# Patient Record
Sex: Female | Born: 1985 | Race: White | Hispanic: No | Marital: Married | Smoking: Never smoker
Health system: Southern US, Community
[De-identification: ages and names within clinical notes are randomized; demographics above are authoritative.]

## PROBLEM LIST (undated history)

## (undated) DIAGNOSIS — Z789 Other specified health status: Secondary | ICD-10-CM

## (undated) HISTORY — PX: NO PAST SURGERIES: SHX2092

---

## 2011-02-14 ENCOUNTER — Inpatient Hospital Stay (HOSPITAL_COMMUNITY)
Admission: AD | Admit: 2011-02-14 | Discharge: 2011-02-17 | DRG: 775 | Disposition: A | Discharge status: Home / Self Care | Payer: 59 | Attending: Obstetrics and Gynecology | Admitting: Obstetrics and Gynecology

## 2011-02-14 LAB — CBC
HCT: 37.8 % (ref 36.0–46.0)
Hemoglobin: 13 g/dL (ref 12.0–15.0)
MCH: 33.5 pg (ref 26.0–34.0)
MCHC: 34.4 g/dL (ref 30.0–36.0)
MCV: 97.4 fL (ref 78.0–100.0)
RDW: 13.3 % (ref 11.5–15.5)

## 2011-02-16 LAB — CBC
HCT: 32.3 % — ABNORMAL LOW (ref 36.0–46.0)
MCH: 32.5 pg (ref 26.0–34.0)
MCHC: 32.5 g/dL (ref 30.0–36.0)
MCV: 100 fL (ref 78.0–100.0)
RDW: 13.6 % (ref 11.5–15.5)
WBC: 13 10*3/uL — ABNORMAL HIGH (ref 4.0–10.5)

## 2012-07-16 LAB — OB RESULTS CONSOLE ABO/RH: RH Type: POSITIVE

## 2012-07-16 LAB — OB RESULTS CONSOLE ANTIBODY SCREEN: Antibody Screen: NEGATIVE

## 2012-07-16 LAB — OB RESULTS CONSOLE HIV ANTIBODY (ROUTINE TESTING): HIV: NONREACTIVE

## 2012-10-08 NOTE — L&D Delivery Note (Signed)
SVD of VFI; EBL 250cc.  Weight and cord pH pending.  APGARs 9,9. Head delivered ROA with mouth and nose bulb suctioned.  Body delivered atraumatically.  Cord clamped, cut, and baby to abdomen.  Cord pH obtained.  Placenta delivered I/3VC with manual extraction.  Fundus firmed with pitocin and massage. Vaginal laceration repaired with 3-0 Rapide in figure-of-8 stitch.  Mom and baby stable.

## 2013-01-20 LAB — OB RESULTS CONSOLE GBS: GBS: NEGATIVE

## 2013-02-14 ENCOUNTER — Encounter (HOSPITAL_COMMUNITY): Payer: Self-pay | Admitting: Anesthesiology

## 2013-02-14 ENCOUNTER — Encounter (HOSPITAL_COMMUNITY): Payer: Self-pay | Admitting: *Deleted

## 2013-02-14 ENCOUNTER — Inpatient Hospital Stay (HOSPITAL_COMMUNITY)
Admission: AD | Admit: 2013-02-14 | Discharge: 2013-02-16 | DRG: 774 | Disposition: A | Discharge status: Home / Self Care | Payer: 59 | Source: Ambulatory Visit | Attending: Obstetrics & Gynecology | Admitting: Obstetrics & Gynecology

## 2013-02-14 ENCOUNTER — Inpatient Hospital Stay (HOSPITAL_COMMUNITY): Payer: 59 | Admitting: Anesthesiology

## 2013-02-14 DIAGNOSIS — O4290 Premature rupture of membranes, unspecified as to length of time between rupture and onset of labor, unspecified weeks of gestation: Principal | ICD-10-CM | POA: Diagnosis present

## 2013-02-14 HISTORY — DX: Other specified health status: Z78.9

## 2013-02-14 LAB — CBC
MCH: 33.5 pg (ref 26.0–34.0)
MCHC: 33.7 g/dL (ref 30.0–36.0)
MCV: 99.5 fL (ref 78.0–100.0)
Platelets: 187 10*3/uL (ref 150–400)
RBC: 3.7 MIL/uL — ABNORMAL LOW (ref 3.87–5.11)

## 2013-02-14 LAB — POCT FERN TEST: POCT Fern Test: NEGATIVE

## 2013-02-14 MED ORDER — LACTATED RINGERS IV SOLN
INTRAVENOUS | Status: DC
  Administered 2013-02-14 (×3): via INTRAVENOUS

## 2013-02-14 MED ORDER — LIDOCAINE HCL (PF) 1 % IJ SOLN
30.0000 mL | INTRAMUSCULAR | Status: DC | PRN
  Filled 2013-02-14 (×2): qty 30

## 2013-02-14 MED ORDER — EPHEDRINE 5 MG/ML INJ
10.0000 mg | INTRAVENOUS | Status: DC | PRN
  Filled 2013-02-14: qty 2

## 2013-02-14 MED ORDER — PHENYLEPHRINE 40 MCG/ML (10ML) SYRINGE FOR IV PUSH (FOR BLOOD PRESSURE SUPPORT)
80.0000 ug | PREFILLED_SYRINGE | INTRAVENOUS | Status: DC | PRN
  Filled 2013-02-14: qty 2

## 2013-02-14 MED ORDER — EPHEDRINE 5 MG/ML INJ
10.0000 mg | INTRAVENOUS | Status: DC | PRN
  Filled 2013-02-14: qty 4
  Filled 2013-02-14: qty 2

## 2013-02-14 MED ORDER — FLEET ENEMA 7-19 GM/118ML RE ENEM: 1.0000 | ENEMA | RECTAL | Status: DC | PRN

## 2013-02-14 MED ORDER — LACTATED RINGERS IV SOLN
500.0000 mL | Freq: Once | INTRAVENOUS | Status: AC
  Administered 2013-02-14: 500 mL via INTRAVENOUS

## 2013-02-14 MED ORDER — CITRIC ACID-SODIUM CITRATE 334-500 MG/5ML PO SOLN: 30.0000 mL | ORAL | Status: DC | PRN

## 2013-02-14 MED ORDER — FENTANYL 2.5 MCG/ML BUPIVACAINE 1/10 % EPIDURAL INFUSION (WH - ANES)
14.0000 mL/h | INTRAMUSCULAR | Status: DC | PRN
  Administered 2013-02-14: 14 mL/h via EPIDURAL
  Filled 2013-02-14: qty 125

## 2013-02-14 MED ORDER — TERBUTALINE SULFATE 1 MG/ML IJ SOLN: 0.2500 mg | Freq: Once | INTRAMUSCULAR | Status: AC | PRN

## 2013-02-14 MED ORDER — ACETAMINOPHEN 325 MG PO TABS: 650.0000 mg | ORAL_TABLET | ORAL | Status: DC | PRN

## 2013-02-14 MED ORDER — DIPHENHYDRAMINE HCL 50 MG/ML IJ SOLN
12.5000 mg | INTRAMUSCULAR | Status: DC | PRN
  Administered 2013-02-14: 12.5 mg via INTRAVENOUS
  Filled 2013-02-14: qty 1

## 2013-02-14 MED ORDER — OXYCODONE-ACETAMINOPHEN 5-325 MG PO TABS: 1.0000 | ORAL_TABLET | ORAL | Status: DC | PRN

## 2013-02-14 MED ORDER — LIDOCAINE HCL (PF) 1 % IJ SOLN
INTRAMUSCULAR | Status: DC | PRN
  Administered 2013-02-14 (×4): 4 mL

## 2013-02-14 MED ORDER — PHENYLEPHRINE 40 MCG/ML (10ML) SYRINGE FOR IV PUSH (FOR BLOOD PRESSURE SUPPORT)
80.0000 ug | PREFILLED_SYRINGE | INTRAVENOUS | Status: DC | PRN
  Filled 2013-02-14: qty 5
  Filled 2013-02-14: qty 2

## 2013-02-14 MED ORDER — OXYTOCIN 40 UNITS IN LACTATED RINGERS INFUSION - SIMPLE MED
62.5000 mL/h | INTRAVENOUS | Status: DC
  Filled 2013-02-14: qty 1000

## 2013-02-14 MED ORDER — ONDANSETRON HCL 4 MG/2ML IJ SOLN
4.0000 mg | Freq: Four times a day (QID) | INTRAMUSCULAR | Status: DC | PRN
  Administered 2013-02-14: 4 mg via INTRAVENOUS
  Filled 2013-02-14: qty 2

## 2013-02-14 MED ORDER — IBUPROFEN 600 MG PO TABS
600.0000 mg | ORAL_TABLET | Freq: Four times a day (QID) | ORAL | Status: DC | PRN
  Administered 2013-02-15: 600 mg via ORAL
  Filled 2013-02-14: qty 1

## 2013-02-14 MED ORDER — LACTATED RINGERS IV SOLN
500.0000 mL | INTRAVENOUS | Status: DC | PRN
  Administered 2013-02-14: 300 mL via INTRAVENOUS

## 2013-02-14 MED ORDER — OXYTOCIN BOLUS FROM INFUSION
500.0000 mL | INTRAVENOUS | Status: DC
  Administered 2013-02-15: 500 mL via INTRAVENOUS

## 2013-02-14 MED ORDER — OXYTOCIN 40 UNITS IN LACTATED RINGERS INFUSION - SIMPLE MED
1.0000 m[IU]/min | INTRAVENOUS | Status: DC
  Administered 2013-02-14: 2 m[IU]/min via INTRAVENOUS

## 2013-02-14 NOTE — Progress Notes (Signed)
Dr Langston Masker notified of pts amnisure results and she is putting in admission orders.

## 2013-02-14 NOTE — Anesthesia Procedure Notes (Addendum)
Epidural Patient location during procedure: OB Start time: 02/14/2013 8:03 PM  Staffing Performed by: anesthesiologist   Preanesthetic Checklist Completed: patient identified, site marked, surgical consent, pre-op evaluation, timeout performed, IV checked, risks and benefits discussed and monitors and equipment checked  Epidural Patient position: sitting Prep: site prepped and draped and DuraPrep Patient monitoring: continuous pulse ox and blood pressure Approach: midline Injection technique: LOR air  Needle:  Needle type: Tuohy  Needle gauge: 17 G Needle length: 9 cm and 9 Needle insertion depth: 4 cm Catheter type: closed end flexible Catheter size: 19 Gauge Catheter at skin depth: 9 cm Test dose: negative  Assessment Events: blood not aspirated, injection not painful, no injection resistance, negative IV test and paresthesia (right leg transient)  Additional Notes Discussed risk of headache, infection, bleeding, nerve injury and failed or incomplete block.  Patient voices understanding and wishes to proceed.  Epidural placed easily on first attempt.  Right leg transient paresthesia.  Patient tolerated procedure well with no apparent complications.  [name] MDReason for block:procedure for pain

## 2013-02-14 NOTE — H&P (Signed)
Katelyn Terrell is a 27 y.o. female presenting for PROM at 0530 this am.  She reports no change in her CTX frequency from 1-2/hour.  No VB.  +FM.  Antepartum course uncomplicated.  Neg GBS.  Maternal Medical History:  Reason for admission: Rupture of membranes.   Contractions: Onset was 1 week ago.   Frequency: rare.   Perceived severity is mild.    Fetal activity: Perceived fetal activity is normal.   Last perceived fetal movement was within the past hour.    Prenatal complications: no prenatal complications Prenatal Complications - Diabetes: none.    OB History   Grav Para Term Preterm Abortions TAB SAB Ect Mult Living   2 1 1  0 0 0  0 0 1     Past Medical History  Diagnosis Date  . Medical history non-contributory    Past Surgical History  Procedure Laterality Date  . No past surgeries     Family History: family history is negative for Alcohol abuse. Social History:  reports that she has never smoked. She has never used smokeless tobacco. She reports that she does not drink alcohol or use illicit drugs.   Prenatal Transfer Tool  Maternal Diabetes: No Genetic Screening: Normal Maternal Ultrasounds/Referrals: Normal Fetal Ultrasounds or other Referrals:  None Maternal Substance Abuse:  No Significant Maternal Medications:  None Significant Maternal Lab Results:  None Other Comments:  None  ROS  Dilation: 2 Effacement (%): 50 Station: -2 Exam by:: V Smith CNM Blood pressure 126/54, pulse 71, temperature 98.4 F (36.9 C), temperature source Oral, resp. rate 20, height 5\' 3"  (1.6 m), weight 146 lb 6.4 oz (66.407 kg), SpO2 100.00%. Maternal Exam:  Uterine Assessment: Contraction strength is mild.  Contraction frequency is rare.   Abdomen: Patient reports no abdominal tenderness. Fundal height is c/w dates.   Estimated fetal weight is 7#2.   Fetal presentation: vertex     Physical Exam  Constitutional: She is oriented to person, place, and time. She appears  well-developed and well-nourished.  GI: Soft. There is no rebound and no guarding.  Neurological: She is alert and oriented to person, place, and time.  Skin: Skin is warm and dry.  Psychiatric: She has a normal mood and affect. Her behavior is normal.    Prenatal labs: ABO, Rh: O/Positive/-- (10/09 0000) Antibody: Negative (10/09 0000) Rubella: Immune (10/09 0000) RPR: Nonreactive (10/09 0000)  HBsAg: Negative (10/09 0000)  HIV: Non-reactive (10/09 0000)  GBS: Negative (04/15 0000)   Assessment/Plan: 27yo G2P1001 with PROM -Patient would like to labor without augmentation.  Will allow until 1800 and add pitocin if needed. -Epidural if desired -GBS negative   Katelyn Terrell 02/14/2013, 12:07 PM

## 2013-02-14 NOTE — Anesthesia Preprocedure Evaluation (Signed)

## 2013-02-14 NOTE — MAU Note (Signed)
Leaking fld since 0530. Having some back pain

## 2013-02-14 NOTE — Progress Notes (Signed)
Dr Langston Masker notified of pts fern results and orders received.

## 2013-02-15 ENCOUNTER — Encounter (HOSPITAL_COMMUNITY): Payer: Self-pay | Admitting: Family Medicine

## 2013-02-15 LAB — CBC
MCH: 33.4 pg (ref 26.0–34.0)
Platelets: 180 10*3/uL (ref 150–400)
RBC: 3.47 MIL/uL — ABNORMAL LOW (ref 3.87–5.11)

## 2013-02-15 MED ORDER — OXYCODONE-ACETAMINOPHEN 5-325 MG PO TABS
1.0000 | ORAL_TABLET | ORAL | Status: DC | PRN
  Administered 2013-02-15 – 2013-02-16 (×4): 1 via ORAL
  Filled 2013-02-15 (×4): qty 1

## 2013-02-15 MED ORDER — PRENATAL MULTIVITAMIN CH
1.0000 | ORAL_TABLET | Freq: Every day | ORAL | Status: DC
  Administered 2013-02-15 – 2013-02-16 (×2): 1 via ORAL
  Filled 2013-02-15 (×2): qty 1

## 2013-02-15 MED ORDER — WITCH HAZEL-GLYCERIN EX PADS: 1.0000 "application " | MEDICATED_PAD | CUTANEOUS | Status: DC | PRN

## 2013-02-15 MED ORDER — DIPHENHYDRAMINE HCL 25 MG PO CAPS: 25.0000 mg | ORAL_CAPSULE | Freq: Four times a day (QID) | ORAL | Status: DC | PRN

## 2013-02-15 MED ORDER — ONDANSETRON HCL 4 MG/2ML IJ SOLN: 4.0000 mg | INTRAMUSCULAR | Status: DC | PRN

## 2013-02-15 MED ORDER — SIMETHICONE 80 MG PO CHEW: 80.0000 mg | CHEWABLE_TABLET | ORAL | Status: DC | PRN

## 2013-02-15 MED ORDER — ONDANSETRON HCL 4 MG PO TABS: 4.0000 mg | ORAL_TABLET | ORAL | Status: DC | PRN

## 2013-02-15 MED ORDER — ZOLPIDEM TARTRATE 5 MG PO TABS: 5.0000 mg | ORAL_TABLET | Freq: Every evening | ORAL | Status: DC | PRN

## 2013-02-15 MED ORDER — BENZOCAINE-MENTHOL 20-0.5 % EX AERO: 1.0000 "application " | INHALATION_SPRAY | CUTANEOUS | Status: DC | PRN

## 2013-02-15 MED ORDER — DOXYCYCLINE HYCLATE 100 MG PO TABS
100.0000 mg | ORAL_TABLET | Freq: Two times a day (BID) | ORAL | Status: AC
  Administered 2013-02-15 (×2): 100 mg via ORAL
  Filled 2013-02-15 (×2): qty 1

## 2013-02-15 MED ORDER — DIBUCAINE 1 % RE OINT: 1.0000 "application " | TOPICAL_OINTMENT | RECTAL | Status: DC | PRN

## 2013-02-15 MED ORDER — SENNOSIDES-DOCUSATE SODIUM 8.6-50 MG PO TABS
2.0000 | ORAL_TABLET | Freq: Every day | ORAL | Status: DC
  Administered 2013-02-15: 2 via ORAL

## 2013-02-15 MED ORDER — IBUPROFEN 600 MG PO TABS
600.0000 mg | ORAL_TABLET | Freq: Four times a day (QID) | ORAL | Status: DC
  Administered 2013-02-15 – 2013-02-16 (×6): 600 mg via ORAL
  Filled 2013-02-15 (×6): qty 1

## 2013-02-15 MED ORDER — TETANUS-DIPHTH-ACELL PERTUSSIS 5-2.5-18.5 LF-MCG/0.5 IM SUSP: 0.5000 mL | Freq: Once | INTRAMUSCULAR | Status: DC

## 2013-02-15 MED ORDER — LANOLIN HYDROUS EX OINT: TOPICAL_OINTMENT | CUTANEOUS | Status: DC | PRN

## 2013-02-15 NOTE — Progress Notes (Signed)
Post Partum Day 0 Subjective: no complaints, up ad lib, voiding and tolerating PO  Objective: Blood pressure 109/61, pulse 66, temperature 97.9 F (36.6 C), temperature source Oral, resp. rate 16, height 5\' 3"  (1.6 m), weight 146 lb 6.4 oz (66.407 kg), SpO2 99.00%, unknown if currently breastfeeding.  Physical Exam:  General: alert, cooperative and appears stated age Lochia: appropriate Uterine Fundus: firm Incision: n/a DVT Evaluation: No evidence of DVT seen on physical exam. Negative Homan's sign. No cords or calf tenderness.   Recent Labs  02/14/13 0850 02/15/13 0610  HGB 12.4 11.6*  HCT 36.8 34.8*    Assessment/Plan: Plan for discharge tomorrow and Breastfeeding   LOS: 1 day   Katelyn Terrell 02/15/2013, 9:41 AM

## 2013-02-15 NOTE — Anesthesia Postprocedure Evaluation (Signed)
Anesthesia Post Note  Patient: Katelyn Terrell  Procedure(s) Performed: * No procedures listed *  Anesthesia type: Epidural  Patient location: Mother/Baby  Post pain: Pain level controlled  Post assessment: Post-op Vital signs reviewed  Last Vitals: BP 109/61  Pulse 66  Temp(Src) 36.6 C (Oral)  Resp 16  Ht 5\' 3"  (1.6 m)  Wt 146 lb 6.4 oz (66.407 kg)  BMI 25.94 kg/m2  SpO2 99%  Post vital signs: Reviewed  Level of consciousness: awake  Complications: No apparent anesthesia complications

## 2013-02-16 MED ORDER — OXYCODONE-ACETAMINOPHEN 5-325 MG PO TABS: 1.0000 | ORAL_TABLET | ORAL | Status: DC | PRN

## 2013-02-16 MED ORDER — IBUPROFEN 600 MG PO TABS: 600.0000 mg | ORAL_TABLET | Freq: Four times a day (QID) | ORAL | Status: DC

## 2013-02-16 NOTE — Lactation Note (Signed)
This note was copied from the chart of Katelyn Yuliza Cara. Lactation Consultation Note Received report from RN: baby is breastfeeding very well with latch scores of 9, and mom is also supplementing with formula. Requested that RN notify LC if mom desires [facility] consult. RN states will ask mom and let me know if mom wants LC visit.   Patient Name: Katelyn Terrell [id number] Date: 02/16/2013     Maternal Data    Feeding    LATCH Score/Interventions                      Lactation Tools Discussed/Used     Consult Status      Lenard Forth 02/16/2013, 2:58 PM

## 2013-02-16 NOTE — Discharge Summary (Signed)
Obstetric Discharge Summary Reason for Admission: rupture of membranes Prenatal Procedures: ultrasound Intrapartum Procedures: spontaneous vaginal delivery Postpartum Procedures: none Complications-Operative and Postpartum: none Hemoglobin  Date Value Range Status  02/15/2013 11.6* 12.0 - 15.0 g/dL Final     HCT  Date Value Range Status  02/15/2013 34.8* 36.0 - 46.0 % Final    Physical Exam:  General: alert and cooperative Lochia: appropriate Uterine Fundus: firm Incision: perineum intact DVT Evaluation: No evidence of DVT seen on physical exam. Negative Homan's sign. No cords or calf tenderness. No significant calf/ankle edema.  Discharge Diagnoses: Term Pregnancy-delivered  Discharge Information: Date: 02/16/2013 Activity: pelvic rest Diet: routine Medications: PNV, Ibuprofen and Percocet Condition: stable Instructions: refer to practice specific booklet Discharge to: home   Newborn Data: Live born female  Birth Weight: 7 lb 4.4 oz (3300 g) APGAR: 9, 9  Home with mother.  CURTIS,CAROL G 02/16/2013, 8:23 AM

## 2014-04-07 ENCOUNTER — Other Ambulatory Visit: Payer: Self-pay | Admitting: Family Medicine

## 2014-04-07 ENCOUNTER — Emergency Department (HOSPITAL_COMMUNITY)
Admission: EM | Admit: 2014-04-07 | Discharge: 2014-04-07 | Disposition: A | Discharge status: Home / Self Care | Payer: 59 | Attending: Emergency Medicine | Admitting: Emergency Medicine

## 2014-04-07 ENCOUNTER — Emergency Department (HOSPITAL_COMMUNITY): Payer: 59

## 2014-04-07 ENCOUNTER — Encounter (HOSPITAL_COMMUNITY): Payer: Self-pay | Admitting: Emergency Medicine

## 2014-04-07 DIAGNOSIS — R1032 Left lower quadrant pain: Secondary | ICD-10-CM

## 2014-04-07 DIAGNOSIS — Z3202 Encounter for pregnancy test, result negative: Secondary | ICD-10-CM | POA: Insufficient documentation

## 2014-04-07 DIAGNOSIS — Z79899 Other long term (current) drug therapy: Secondary | ICD-10-CM | POA: Insufficient documentation

## 2014-04-07 LAB — CBC WITH DIFFERENTIAL/PLATELET
BASOS PCT: 0 % (ref 0–1)
Basophils Absolute: 0 10*3/uL (ref 0.0–0.1)
EOS ABS: 0 10*3/uL (ref 0.0–0.7)
EOS PCT: 1 % (ref 0–5)
HCT: 41.3 % (ref 36.0–46.0)
HEMOGLOBIN: 14.1 g/dL (ref 12.0–15.0)
LYMPHS ABS: 2.3 10*3/uL (ref 0.7–4.0)
Lymphocytes Relative: 36 % (ref 12–46)
MCH: 31.4 pg (ref 26.0–34.0)
MCHC: 34.1 g/dL (ref 30.0–36.0)
MCV: 92 fL (ref 78.0–100.0)
MONOS PCT: 7 % (ref 3–12)
Monocytes Absolute: 0.4 10*3/uL (ref 0.1–1.0)
NEUTROS PCT: 56 % (ref 43–77)
Neutro Abs: 3.5 10*3/uL (ref 1.7–7.7)
Platelets: 252 10*3/uL (ref 150–400)
RBC: 4.49 MIL/uL (ref 3.87–5.11)
RDW: 12.1 % (ref 11.5–15.5)
WBC: 6.2 10*3/uL (ref 4.0–10.5)

## 2014-04-07 LAB — URINALYSIS, ROUTINE W REFLEX MICROSCOPIC
Glucose, UA: NEGATIVE mg/dL
HGB URINE DIPSTICK: NEGATIVE
KETONES UR: NEGATIVE mg/dL
Leukocytes, UA: NEGATIVE
NITRITE: NEGATIVE
PROTEIN: 30 mg/dL — AB
SPECIFIC GRAVITY, URINE: 1.043 — AB (ref 1.005–1.030)
UROBILINOGEN UA: 2 mg/dL — AB (ref 0.0–1.0)
pH: 6 (ref 5.0–8.0)

## 2014-04-07 LAB — I-STAT CHEM 8, ED
BUN: 8 mg/dL (ref 6–23)
CALCIUM ION: 1.12 mmol/L (ref 1.12–1.23)
CREATININE: 0.8 mg/dL (ref 0.50–1.10)
Chloride: 103 mEq/L (ref 96–112)
GLUCOSE: 92 mg/dL (ref 70–99)
HEMATOCRIT: 45 % (ref 36.0–46.0)
HEMOGLOBIN: 15.3 g/dL — AB (ref 12.0–15.0)
Potassium: 3.5 mEq/L — ABNORMAL LOW (ref 3.7–5.3)
Sodium: 139 mEq/L (ref 137–147)
TCO2: 22 mmol/L (ref 0–100)

## 2014-04-07 LAB — POC URINE PREG, ED: Preg Test, Ur: NEGATIVE

## 2014-04-07 LAB — WET PREP, GENITAL
CLUE CELLS WET PREP: NONE SEEN
Trich, Wet Prep: NONE SEEN
Yeast Wet Prep HPF POC: NONE SEEN

## 2014-04-07 LAB — URINE MICROSCOPIC-ADD ON

## 2014-04-07 NOTE — ED Notes (Signed)
She states she experienced sudden low abd. Pain today at about 1200 hours, and "was so bad I went to see a doctor".  She states they performed blood work and urinalysis which were "all normal".  She states "When the doctor pushed on my stomach it really hurt in the lower left."  She denies fever/n/v/d/dysuria/spotting or bleeding and is in no distress.  She states she had a normal period about two weeks ago; and had negative preg. Test at urgent care today.

## 2014-04-07 NOTE — ED Provider Notes (Signed)
CSN: [id number]     Arrival date & time 04/07/14  1706 History   First MD Initiated Contact with Patient 04/07/14 1729     Chief Complaint  Patient presents with  . Abdominal Pain     (Consider location/radiation/quality/duration/timing/severity/associated sxs/prior Treatment) HPI Katelyn Terrell is a 28 y.o. female who presents emergency department complaining of abdominal pain. Patient states she developed sudden onset of left lower abdominal pain this morning. She states pain is sharp, constant. States "it feels like labor pain." States pain is sharp, does not radiate, worsened with movement and palpation. She went to urgent care where they did urinalysis, urine pregnancy, lab work, was told everything looks normal but was told to come to emergency department for further tests. Patient states she did not take any medications prior to coming in. She has not had any change in her bowels but has not had a bowel movement today. She denies any urinary symptoms. No vaginal discharge or bleeding, last menstrual cycle was 2 weeks ago and was normal.  Past Medical History  Diagnosis Date  . Medical history non-contributory    Past Surgical History  Procedure Laterality Date  . No past surgeries     Family History  Problem Relation Age of Onset  . Alcohol abuse Neg Hx    History  Substance Use Topics  . Smoking status: Never Smoker   . Smokeless tobacco: Never Used  . Alcohol Use: No   OB History   Grav Para Term Preterm Abortions TAB SAB Ect Mult Living   2 2 2  0 0 0  0 0 2     Review of Systems  Constitutional: Negative for fever and chills.  Respiratory: Negative for cough, chest tightness and shortness of breath.   Cardiovascular: Negative for chest pain, palpitations and leg swelling.  Gastrointestinal: Positive for abdominal pain. Negative for nausea, vomiting, diarrhea and abdominal distention.  Genitourinary: Negative for dysuria, flank pain, vaginal bleeding, vaginal  discharge, vaginal pain and pelvic pain.  Musculoskeletal: Negative for arthralgias, myalgias, neck pain and neck stiffness.  Skin: Negative for rash.  Neurological: Negative for dizziness, weakness and headaches.  All other systems reviewed and are negative.     Allergies  Review of patient's allergies indicates no known allergies.  Home Medications   Prior to Admission medications   Medication Sig Start Date End Date Taking? Authorizing Provider  Multiple Vitamin (MULTIVITAMIN WITH MINERALS) TABS tablet Take 1 tablet by mouth daily.   Yes Historical Provider, MD  norgestimate-ethinyl estradiol (MONONESSA) 0.25-35 MG-MCG tablet Take 1 tablet by mouth daily.   Yes Historical Provider, MD   BP 156/88  Pulse 62  Temp(Src) 97.8 F (36.6 C) (Oral)  Resp 16  SpO2 94%  LMP 03/19/2014  Breastfeeding? No Physical Exam  Nursing note and vitals reviewed. Constitutional: She is oriented to person, place, and time. She appears well-developed and well-nourished. No distress.  HENT:  Head: Normocephalic.  Eyes: Conjunctivae are normal.  Neck: Neck supple.  Cardiovascular: Normal rate, regular rhythm and normal heart sounds.   Pulmonary/Chest: Effort normal and breath sounds normal. No respiratory distress. She has no wheezes. She has no rales.  Abdominal: Soft. Bowel sounds are normal. She exhibits no distension. There is tenderness. There is no rebound and no guarding.  Left lower quadrant tenderness  Genitourinary:  Normal external genitalia. Normal vaginal canal. Small thin white discharge. Cervix is normal, closed. No CMT. No uterine tenderness. Left adnexal tenderness. No masses palpated.   Musculoskeletal:  She exhibits no edema.  Neurological: She is alert and oriented to person, place, and time.  Skin: Skin is warm and dry.  Psychiatric: She has a normal mood and affect. Her behavior is normal.    ED Course  Procedures (including critical care time) Labs Review Labs Reviewed   WET PREP, GENITAL - Abnormal; Notable for the following:    WBC, Wet Prep HPF POC FEW (*)    All other components within normal limits  URINALYSIS, ROUTINE W REFLEX MICROSCOPIC - Abnormal; Notable for the following:    Color, Urine AMBER (*)    Specific Gravity, Urine 1.043 (*)    Bilirubin Urine MODERATE (*)    Protein, ur 30 (*)    Urobilinogen, UA 2.0 (*)    All other components within normal limits  I-STAT CHEM 8, ED - Abnormal; Notable for the following:    Potassium 3.5 (*)    Hemoglobin 15.3 (*)    All other components within normal limits  GC/CHLAMYDIA PROBE AMP  CBC WITH DIFFERENTIAL  URINE MICROSCOPIC-ADD ON  POC URINE PREG, ED    Imaging Review [place] Transvaginal Non-ob  04/07/2014   CLINICAL DATA:  Left lower quadrant abdominal pain.  EXAM: TRANSABDOMINAL AND TRANSVAGINAL ULTRASOUND OF PELVIS  DOPPLER ULTRASOUND OF OVARIES  TECHNIQUE: Both transabdominal and transvaginal ultrasound examinations of the pelvis were performed. Transabdominal technique was performed for global imaging of the pelvis including uterus, ovaries, adnexal regions, and pelvic cul-de-sac.  It was necessary to proceed with endovaginal exam following the transabdominal exam to visualize the endometrium. Color and duplex Doppler ultrasound was utilized to evaluate blood flow to the ovaries.  COMPARISON:  None.  FINDINGS: Uterus  Measurements: 9.5 x 3.0 x 4.8 cm. No fibroids or other mass visualized.  Endometrium  Thickness: 3 mm.  No focal abnormality visualized.  Right ovary  Measurements: 3.2 x 1.6 x 1.3 cm. Normal appearance/no adnexal mass.  Left ovary  Measurements: 2.9 x 1.5 x 1.9 cm. Normal appearance/no adnexal mass.  Pulsed Doppler evaluation of both ovaries demonstrates normal low-resistance arterial and venous waveforms.  Other findings  No free fluid.  IMPRESSION: 1. Normal sonographic appearance of the pelvis, without specific factor explaining the patient's left lower quadrant pain.   Electronically  Signed   By: Herbie BaltimoreWalt  Liebkemann M.D.   On: 04/07/2014 19:31   [place] Pelvis Complete  04/07/2014   CLINICAL DATA:  Left lower quadrant abdominal pain.  EXAM: TRANSABDOMINAL AND TRANSVAGINAL ULTRASOUND OF PELVIS  DOPPLER ULTRASOUND OF OVARIES  TECHNIQUE: Both transabdominal and transvaginal ultrasound examinations of the pelvis were performed. Transabdominal technique was performed for global imaging of the pelvis including uterus, ovaries, adnexal regions, and pelvic cul-de-sac.  It was necessary to proceed with endovaginal exam following the transabdominal exam to visualize the endometrium. Color and duplex Doppler ultrasound was utilized to evaluate blood flow to the ovaries.  COMPARISON:  None.  FINDINGS: Uterus  Measurements: 9.5 x 3.0 x 4.8 cm. No fibroids or other mass visualized.  Endometrium  Thickness: 3 mm.  No focal abnormality visualized.  Right ovary  Measurements: 3.2 x 1.6 x 1.3 cm. Normal appearance/no adnexal mass.  Left ovary  Measurements: 2.9 x 1.5 x 1.9 cm. Normal appearance/no adnexal mass.  Pulsed Doppler evaluation of both ovaries demonstrates normal low-resistance arterial and venous waveforms.  Other findings  No free fluid.  IMPRESSION: 1. Normal sonographic appearance of the pelvis, without specific factor explaining the patient's left lower quadrant pain.   Electronically Signed  By: Herbie Baltimore M.D.   On: 04/07/2014 19:31   [place] Art/ven Flow Abd Pelv Doppler  04/07/2014   CLINICAL DATA:  Left lower quadrant abdominal pain.  EXAM: TRANSABDOMINAL AND TRANSVAGINAL ULTRASOUND OF PELVIS  DOPPLER ULTRASOUND OF OVARIES  TECHNIQUE: Both transabdominal and transvaginal ultrasound examinations of the pelvis were performed. Transabdominal technique was performed for global imaging of the pelvis including uterus, ovaries, adnexal regions, and pelvic cul-de-sac.  It was necessary to proceed with endovaginal exam following the transabdominal exam to visualize the endometrium. Color and duplex  Doppler ultrasound was utilized to evaluate blood flow to the ovaries.  COMPARISON:  None.  FINDINGS: Uterus  Measurements: 9.5 x 3.0 x 4.8 cm. No fibroids or other mass visualized.  Endometrium  Thickness: 3 mm.  No focal abnormality visualized.  Right ovary  Measurements: 3.2 x 1.6 x 1.3 cm. Normal appearance/no adnexal mass.  Left ovary  Measurements: 2.9 x 1.5 x 1.9 cm. Normal appearance/no adnexal mass.  Pulsed Doppler evaluation of both ovaries demonstrates normal low-resistance arterial and venous waveforms.  Other findings  No free fluid.  IMPRESSION: 1. Normal sonographic appearance of the pelvis, without specific factor explaining the patient's left lower quadrant pain.   Electronically Signed   By: Herbie Baltimore M.D.   On: 04/07/2014 19:31     EKG Interpretation None      MDM   Final diagnoses:  Left lower quadrant pain    Patient emergency department with left lower quadrant abdominal pain. She appears to be uncomfortable initial exam, labs obtained, they'll exam unremarkable, will get ultrasound of pelvis to rule out ovarian cyst versus torsion.  Patient's white blood count is normal, no changes in bowels, doubt colitis or diverticulitis however this possibility discussed with her. She is refusing CT scan at this time. Ultrasound is normal. Patient states that she is feeling well currently and her pain has completely resolved. I have made her get up and ambulate in the hallways, patient continues to feel well with no pain. Abdomen reassessed and is nontender at this time. I will discharge her home at this time, I have given her strict precautions to return if her symptoms return. She was understanding. She is requesting to be discharged home at this time  Filed Vitals:   04/07/14 1712 04/07/14 1933  BP: 156/88 110/73  Pulse: 62 65  Temp: 97.8 F (36.6 C)   TempSrc: Oral   Resp: 16 14  SpO2: 94% 100%       Myriam Jacobson Tonda Wiederhold, PA-C 04/07/14 2331

## 2014-04-07 NOTE — Discharge Instructions (Signed)
Ibuprofen or tylenol for pain. If pain returns, please return back to the emergency department.    Abdominal Pain, Women Abdominal (stomach, pelvic, or belly) pain can be caused by many things. It is important to tell your doctor:  The location of the pain.  Does it come and go or is it present all the time?  Are there things that start the pain (eating certain foods, exercise)?  Are there other symptoms associated with the pain (fever, nausea, vomiting, diarrhea)? All of this is helpful to know when trying to find the cause of the pain. CAUSES   Stomach: virus or bacteria infection, or ulcer.  Intestine: appendicitis (inflamed appendix), regional ileitis (Crohn's disease), ulcerative colitis (inflamed colon), irritable bowel syndrome, diverticulitis (inflamed diverticulum of the colon), or cancer of the stomach or intestine.  Gallbladder disease or stones in the gallbladder.  Kidney disease, kidney stones, or infection.  Pancreas infection or cancer.  Fibromyalgia (pain disorder).  Diseases of the female organs:  Uterus: fibroid (non-cancerous) tumors or infection.  Fallopian tubes: infection or tubal pregnancy.  Ovary: cysts or tumors.  Pelvic adhesions (scar tissue).  Endometriosis (uterus lining tissue growing in the pelvis and on the pelvic organs).  Pelvic congestion syndrome (female organs filling up with blood just before the menstrual period).  Pain with the menstrual period.  Pain with ovulation (producing an egg).  Pain with an IUD (intrauterine device, birth control) in the uterus.  Cancer of the female organs.  Functional pain (pain not caused by a disease, may improve without treatment).  Psychological pain.  Depression. DIAGNOSIS  Your doctor will decide the seriousness of your pain by doing an examination.  Blood tests.  X-rays.  Ultrasound.  CT scan (computed tomography, special type of X-ray).  MRI (magnetic resonance  imaging).  Cultures, for infection.  Barium enema (dye inserted in the large intestine, to better view it with X-rays).  Colonoscopy (looking in intestine with a lighted tube).  Laparoscopy (minor surgery, looking in abdomen with a lighted tube).  Major abdominal exploratory surgery (looking in abdomen with a large incision). TREATMENT  The treatment will depend on the cause of the pain.   Many cases can be observed and treated at home.  Over-the-counter medicines recommended by your caregiver.  Prescription medicine.  Antibiotics, for infection.  Birth control pills, for painful periods or for ovulation pain.  Hormone treatment, for endometriosis.  Nerve blocking injections.  Physical therapy.  Antidepressants.  Counseling with a psychologist or psychiatrist.  Minor or major surgery. HOME CARE INSTRUCTIONS   Do not take laxatives, unless directed by your caregiver.  Take over-the-counter pain medicine only if ordered by your caregiver. Do not take aspirin because it can cause an upset stomach or bleeding.  Try a clear liquid diet (broth or water) as ordered by your caregiver. Slowly move to a bland diet, as tolerated, if the pain is related to the stomach or intestine.  Have a thermometer and take your temperature several times a day, and record it.  Bed rest and sleep, if it helps the pain.  Avoid sexual intercourse, if it causes pain.  Avoid stressful situations.  Keep your follow-up appointments and tests, as your caregiver orders.  If the pain does not go away with medicine or surgery, you may try:  Acupuncture.  Relaxation exercises (yoga, meditation).  Group therapy.  Counseling. SEEK MEDICAL CARE IF:   You notice certain foods cause stomach pain.  Your home care treatment is not helping your  pain.  You need stronger pain medicine.  You want your IUD removed.  You feel faint or lightheaded.  You develop nausea and vomiting.  You  develop a rash.  You are having side effects or an allergy to your medicine. SEEK IMMEDIATE MEDICAL CARE IF:   Your pain does not go away or gets worse.  You have a fever.  Your pain is felt only in portions of the abdomen. The right side could possibly be appendicitis. The left lower portion of the abdomen could be colitis or diverticulitis.  You are passing blood in your stools (bright red or black tarry stools, with or without vomiting).  You have blood in your urine.  You develop chills, with or without a fever.  You pass out. MAKE SURE YOU:   Understand these instructions.  Will watch your condition.  Will get help right away if you are not doing well or get worse. Document Released: 07/22/2007 Document Revised: 12/17/2011 Document Reviewed: 08/11/2009 [facility] Patient Information 2015 [employer], [place]. This information is not intended to replace advice given to you by your health care provider. Make sure you discuss any questions you have with your health care provider.

## 2014-04-07 NOTE — ED Notes (Addendum)
Pt c/o L lower abdoinal pain/suprapubic pain. Pt was seen at urgent care and they recommended coming to ED for a CT scan. Pt sts she hasn't felt pain like this since labor. Pt sts she feels a sharp pain when L lower side is palpated. Pt A&Ox4. Ambulatory in triage. Pt Denies N/V, urinary symptoms, vaginal discharge. Pt also sts she has spider bite on R upper abdomen from last week that got worse yesterday.

## 2014-04-08 LAB — GC/CHLAMYDIA PROBE AMP
CT Probe RNA: NEGATIVE
GC Probe RNA: NEGATIVE

## 2014-04-08 NOTE — ED Provider Notes (Signed)
Medical screening examination/treatment/procedure(s) were performed by non-physician practitioner and as supervising physician I was immediately available for consultation/collaboration.  Toy BakerAnthony T Quorra Rosene, MD 04/08/14 1640

## 2014-04-14 ENCOUNTER — Other Ambulatory Visit: Payer: 59

## 2014-08-09 ENCOUNTER — Encounter (HOSPITAL_COMMUNITY): Payer: Self-pay | Admitting: Emergency Medicine

## 2015-07-10 IMAGING — US US PELVIS COMPLETE
1 series · 13 of 25 positions shown · non-contrast
Comparison: None.

CLINICAL DATA: Left lower quadrant abdominal pain.

EXAM:
TRANSABDOMINAL AND TRANSVAGINAL ULTRASOUND OF PELVIS
DOPPLER ULTRASOUND OF OVARIES
TECHNIQUE: Both transabdominal and transvaginal ultrasound examinations of the
pelvis were performed. Transabdominal technique was performed for
global imaging of the pelvis including uterus, ovaries, adnexal
regions, and pelvic cul-de-sac.
It was necessary to proceed with endovaginal exam following the
transabdominal exam to visualize the endometrium. Color and duplex
Doppler ultrasound was utilized to evaluate blood flow to the
ovaries.

[Series 1: us pelvis complete · 0.17mm/px · 13 of 44 slices shown]
[im 1/44]
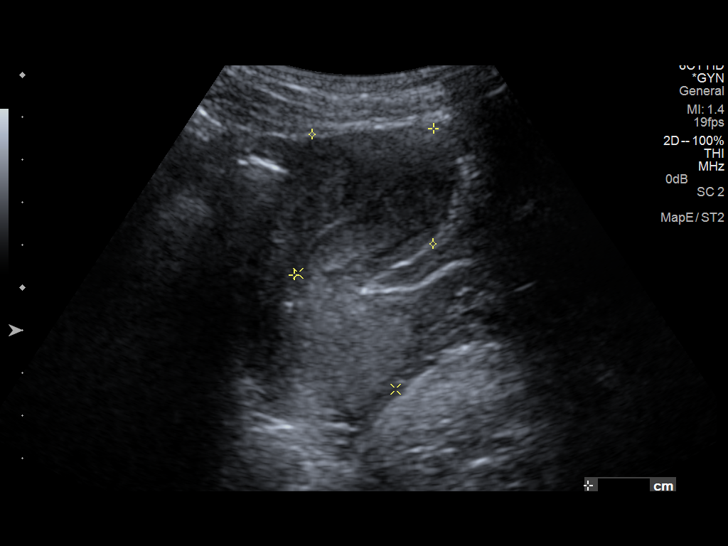
[im 4/44]
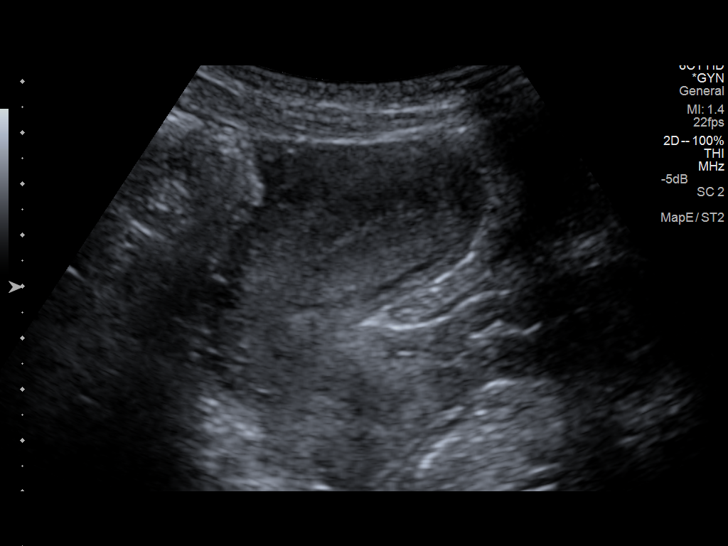
[im 8/44]
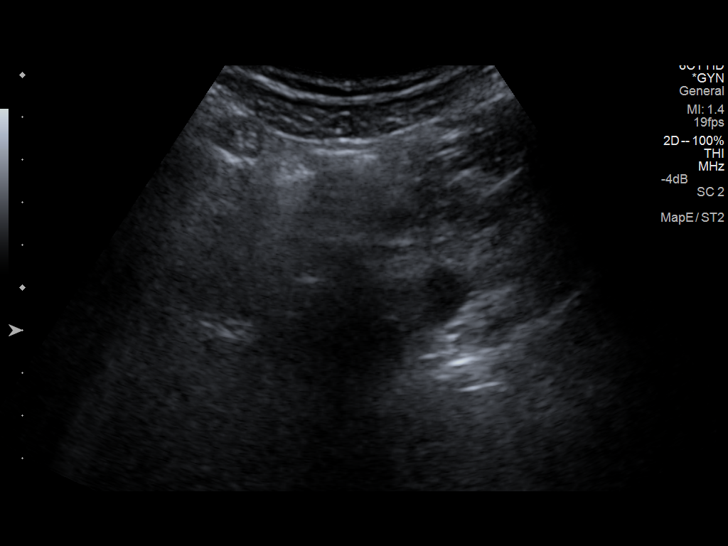
[im 11/44]
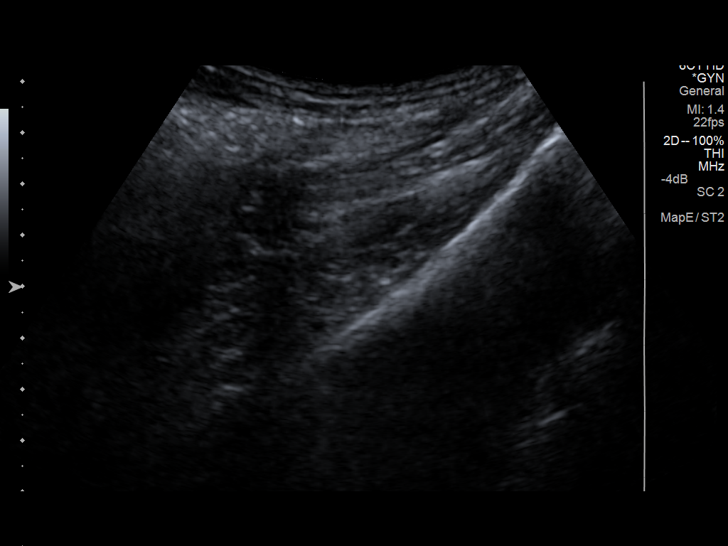
[im 15/44]
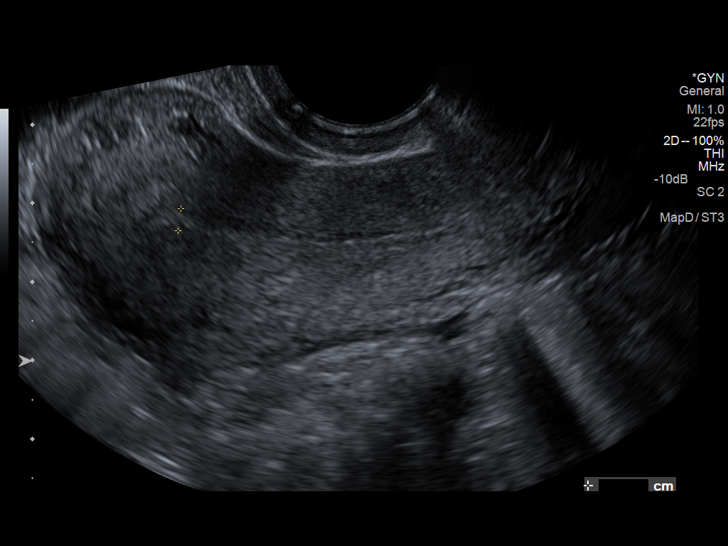
[im 18/44]
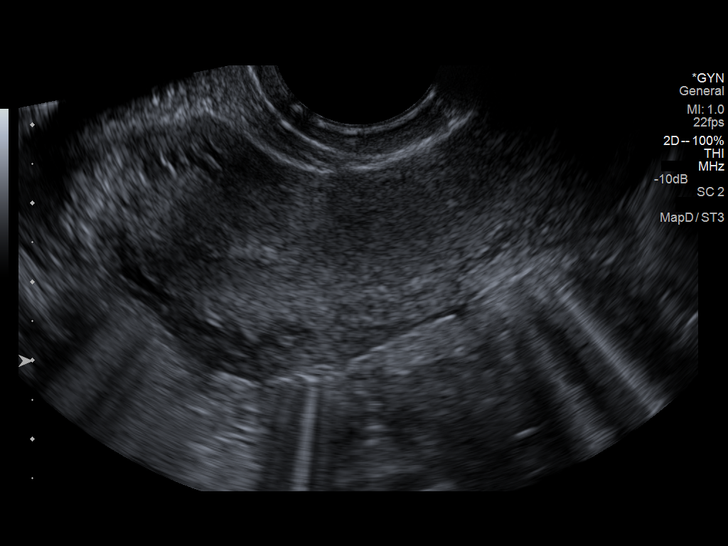
[im 22/44]
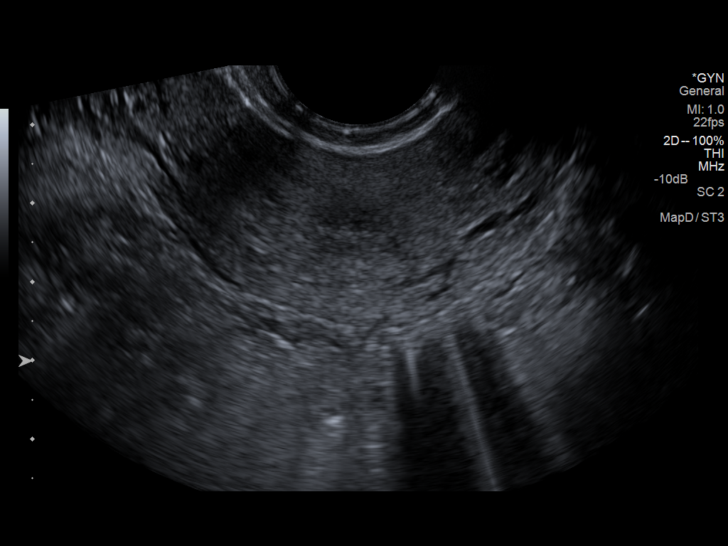
[im 26/44]
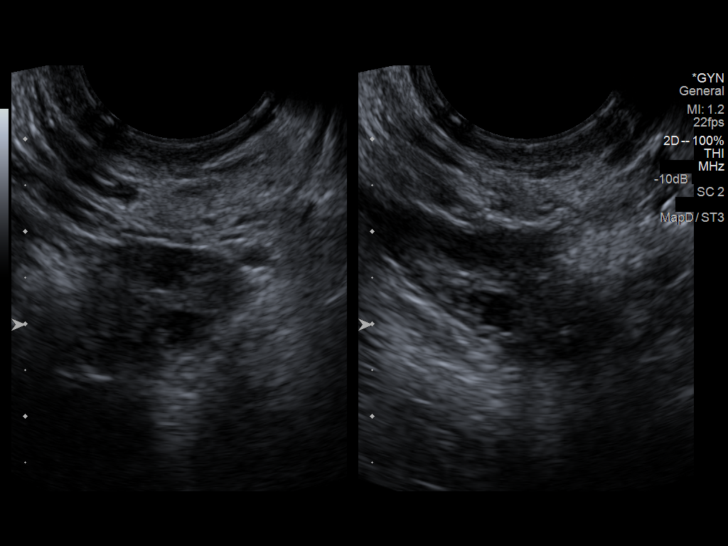
[im 29/44]
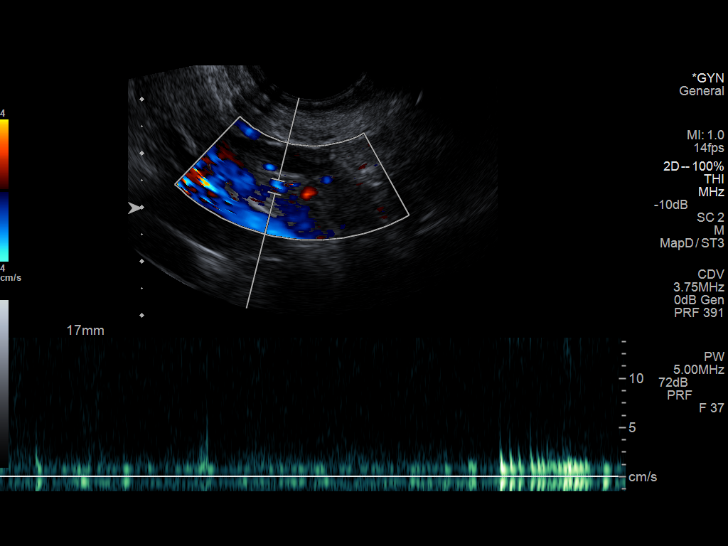
[im 33/44]
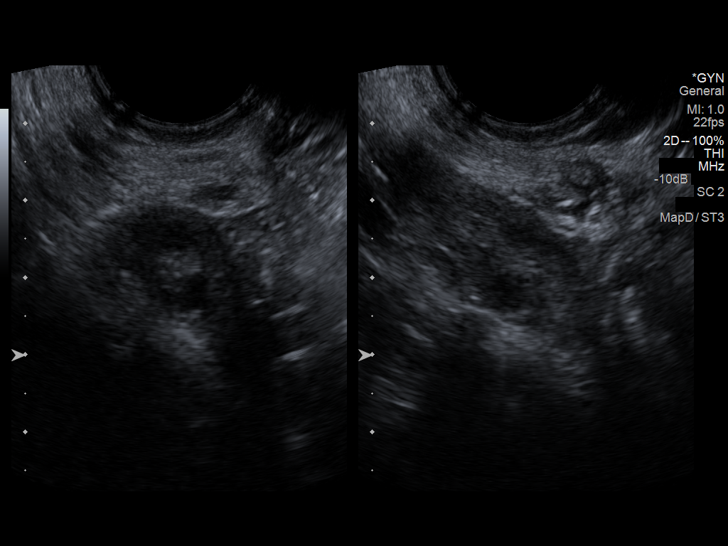
[im 36/44]
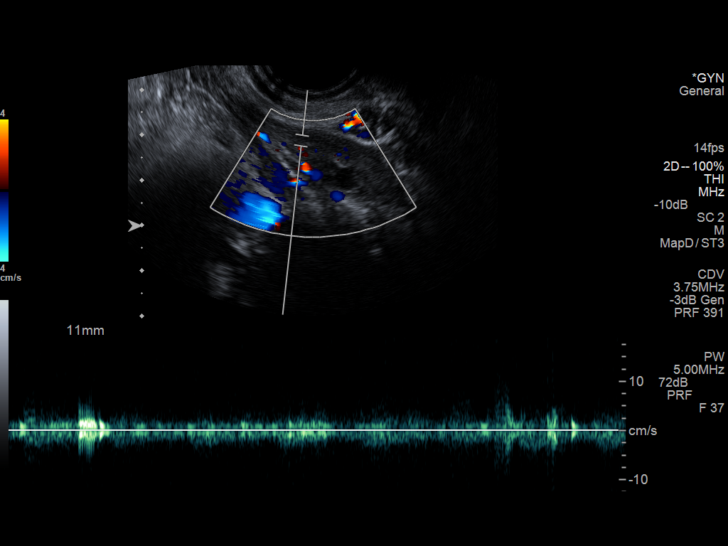
[im 40/44]
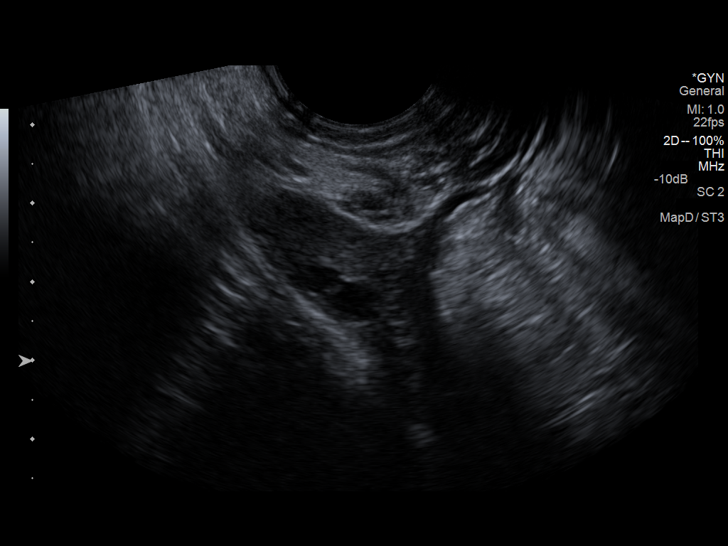
[im 44/44]
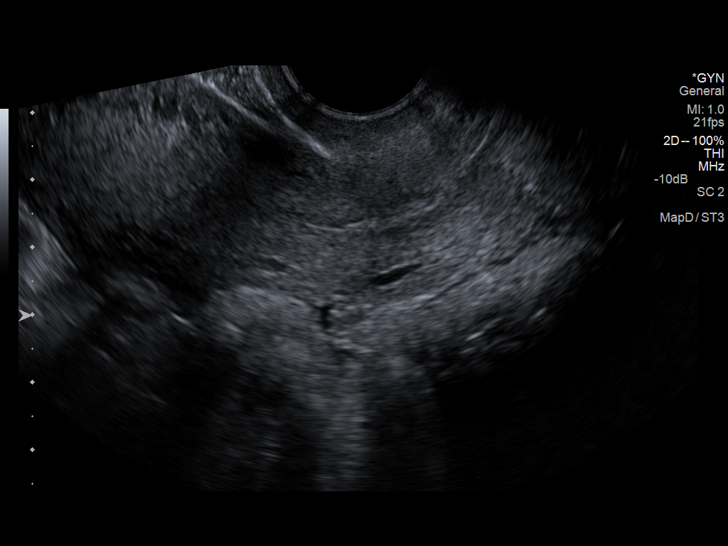

[13 of 25 positions shown; findings below may reference images not displayed]

FINDINGS: Uterus

Measurements: 9.5 x 3.0 x 4.8 cm. No fibroids or other mass
visualized.

Endometrium

Thickness: 3 mm.  No focal abnormality visualized.

Right ovary

Measurements: 3.2 x 1.6 x 1.3 cm. Normal appearance/no adnexal mass.

Left ovary

Measurements: 2.9 x 1.5 x 1.9 cm. Normal appearance/no adnexal mass.

Pulsed Doppler evaluation of both ovaries demonstrates normal
low-resistance arterial and venous waveforms.

Other findings

No free fluid.
IMPRESSION: 1. Normal sonographic appearance of the pelvis, without specific
factor explaining the patient's left lower quadrant pain.
